# Patient Record
Sex: Male | Born: 1980 | Race: Black or African American | Hispanic: No | Marital: Married | Smoking: Former smoker
Health system: Southern US, Community
[De-identification: ages and names within clinical notes are randomized; demographics above are authoritative.]

## PROBLEM LIST (undated history)

## (undated) DIAGNOSIS — H5010 Unspecified exotropia: Secondary | ICD-10-CM

## (undated) HISTORY — PX: ROOT CANAL: SHX2363

---

## 2013-11-17 ENCOUNTER — Emergency Department (HOSPITAL_BASED_OUTPATIENT_CLINIC_OR_DEPARTMENT_OTHER): Payer: Worker's Compensation

## 2013-11-17 ENCOUNTER — Encounter (HOSPITAL_BASED_OUTPATIENT_CLINIC_OR_DEPARTMENT_OTHER): Payer: Self-pay | Admitting: Emergency Medicine

## 2013-11-17 ENCOUNTER — Emergency Department (HOSPITAL_BASED_OUTPATIENT_CLINIC_OR_DEPARTMENT_OTHER)
Admission: EM | Admit: 2013-11-17 | Discharge: 2013-11-17 | Disposition: A | Discharge status: Home / Self Care | Payer: Worker's Compensation | Attending: Emergency Medicine | Admitting: Emergency Medicine

## 2013-11-17 DIAGNOSIS — X500XXA Overexertion from strenuous movement or load, initial encounter: Secondary | ICD-10-CM | POA: Insufficient documentation

## 2013-11-17 DIAGNOSIS — Y9289 Other specified places as the place of occurrence of the external cause: Secondary | ICD-10-CM | POA: Insufficient documentation

## 2013-11-17 DIAGNOSIS — Y990 Civilian activity done for income or pay: Secondary | ICD-10-CM | POA: Insufficient documentation

## 2013-11-17 DIAGNOSIS — S43402A Unspecified sprain of left shoulder joint, initial encounter: Secondary | ICD-10-CM

## 2013-11-17 DIAGNOSIS — F17200 Nicotine dependence, unspecified, uncomplicated: Secondary | ICD-10-CM | POA: Insufficient documentation

## 2013-11-17 DIAGNOSIS — IMO0002 Reserved for concepts with insufficient information to code with codable children: Secondary | ICD-10-CM | POA: Insufficient documentation

## 2013-11-17 DIAGNOSIS — Y9389 Activity, other specified: Secondary | ICD-10-CM | POA: Insufficient documentation

## 2013-11-17 MED ORDER — HYDROCODONE-ACETAMINOPHEN 5-325 MG PO TABS: 2.0000 | ORAL_TABLET | ORAL | Status: DC | PRN

## 2013-11-17 MED ORDER — IBUPROFEN 600 MG PO TABS: 600.0000 mg | ORAL_TABLET | Freq: Four times a day (QID) | ORAL | Status: DC | PRN

## 2013-11-17 NOTE — ED Provider Notes (Signed)
CSN: [id number]     Arrival date & time 11/17/13  1014 History   First MD Initiated Contact with Patient 11/17/13 1113     Chief Complaint  Patient presents with  . Shoulder Injury   (Consider location/radiation/quality/duration/timing/severity/associated sxs/prior Treatment) HPI Comments: Patient presents with shoulder pain. He's states that yesterday he was lifting some furniture and at [employer] where he works and developed pain to his left shoulder. He states it's been hurting since that time. He has difficulty lifting his arm over his head. He had some tingling in his hand yesterday but none today. He denies any weakness in the arm other than diminished strength due to the pain. He has no prior injuries to her shoulder. He denies any other injuries. He describes as a constant throbbing pain.  Patient is a 32 y.o. male presenting with shoulder injury.  Shoulder Injury Pertinent negatives include no headaches.    No past medical history on file. No past surgical history on file. No family history on file. History  Substance Use Topics  . Smoking status: Current Every Day Smoker  . Smokeless tobacco: Not on file  . Alcohol Use: Not on file    Review of Systems  Constitutional: Negative for fever.  Respiratory: Negative.   Cardiovascular: Negative.   Gastrointestinal: Negative for nausea and vomiting.  Musculoskeletal: Positive for arthralgias and joint swelling. Negative for back pain and neck pain.  Skin: Negative for wound.  Neurological: Positive for numbness. Negative for weakness and headaches.    Allergies  Review of patient's allergies indicates no known allergies.  Home Medications   Current Outpatient Rx  Name  Route  Sig  Dispense  Refill  . HYDROcodone-acetaminophen (NORCO/VICODIN) 5-325 MG per tablet   Oral   Take 2 tablets by mouth every 4 (four) hours as needed.   15 tablet   0   . ibuprofen (ADVIL,MOTRIN) 600 MG tablet   Oral   Take 1 tablet  (600 mg total) by mouth every 6 (six) hours as needed.   30 tablet   0    BP 137/80  Pulse 57  Temp(Src) 98 F (36.7 C) (Oral)  Resp 16  Ht 6' (1.829 m)  Wt 180 lb (81.647 kg)  BMI 24.41 kg/m2  SpO2 100% Physical Exam  Constitutional: He is oriented to person, place, and time. He appears well-developed and well-nourished.  HENT:  Head: Normocephalic and atraumatic.  Eyes: Pupils are equal, round, and reactive to light.  Neck: Normal range of motion. Neck supple.  Cardiovascular: Normal rate, regular rhythm and normal heart sounds.   Pulmonary/Chest: Effort normal and breath sounds normal. No respiratory distress. He has no wheezes. He has no rales. He exhibits no tenderness.  Abdominal: Soft. Bowel sounds are normal. There is no tenderness. There is no rebound and no guarding.  Musculoskeletal: Normal range of motion. He exhibits edema.  Patient has some mild edema to the left shoulder. There is no pain along the clavicle. There is tenderness along the anterior portion the shoulder. There is pain on abduction and external rotation of the shoulder. He has normal sensation in the left hand. He has normal pulses in the left arm. He has normal motor function in the left hand. There is no pain to the elbow joint.  Lymphadenopathy:    He has no cervical adenopathy.  Neurological: He is alert and oriented to person, place, and time.  Skin: Skin is warm and dry. No rash noted.  Psychiatric: He  has a normal mood and affect.    ED Course  Procedures (including critical care time) Labs Review Labs Reviewed - No data to display Imaging Review Dg Shoulder Left  11/17/2013   CLINICAL DATA:  Left shoulder injury complaining of persistent left shoulder pain.  EXAM: LEFT SHOULDER - 2+ VIEW  COMPARISON:  No priors.  FINDINGS: Multiple views of the left shoulder demonstrate no acute displaced fracture, subluxation, dislocation, or soft tissue abnormality.  IMPRESSION: No acute radiographic  abnormality of the left shoulder.   Electronically Signed   By: Trudie Reed M.D.   On: 11/17/2013 12:28    EKG Interpretation   None       MDM   1. Shoulder sprain, left, initial encounter    There is no evidence of fracture. I feel he likely has a rotator cuff injury. He has no neurologic deficits currently. He was placed in a shoulder sling. He was advised in ice and elevation. He was given prescription for ibuprofen and Vicodin. He was given a referral to followup with Dr. Pearletha Forge.    Rolan Bucco, MD 11/17/13 [phone]

## 2013-11-17 NOTE — ED Notes (Signed)
Pt injured left shoulder while lifting shrimp boxes.  Pt has pain in shoulder to arm.  Difficulty lifting over his head.  Good pulses.

## 2013-11-27 ENCOUNTER — Ambulatory Visit (INDEPENDENT_AMBULATORY_CARE_PROVIDER_SITE_OTHER): Payer: Worker's Compensation | Admitting: Family Medicine

## 2013-11-27 ENCOUNTER — Encounter: Payer: Self-pay | Admitting: Family Medicine

## 2013-11-27 VITALS — BP 127/82 | HR 89 | Ht 73.0 in | Wt 185.0 lb

## 2013-11-27 DIAGNOSIS — S46909A Unspecified injury of unspecified muscle, fascia and tendon at shoulder and upper arm level, unspecified arm, initial encounter: Secondary | ICD-10-CM

## 2013-11-27 DIAGNOSIS — S4992XA Unspecified injury of left shoulder and upper arm, initial encounter: Secondary | ICD-10-CM

## 2013-11-27 DIAGNOSIS — S4980XA Other specified injuries of shoulder and upper arm, unspecified arm, initial encounter: Secondary | ICD-10-CM

## 2013-11-27 NOTE — Patient Instructions (Signed)
You have strained your biceps tendon and rotator cuff (supraspinatus). Rest is important initially - use sling. Try to avoid painful activities (overhead activities, lifting with extended arm) as much as possible. Aleve 2 tabs twice a day with food OR ibuprofen 3 tabs three times a day with food for pain and inflammation - take regularly for next 2 weeks. Can take tylenol in addition to this. At follow up we will likely start physical therapy with transition to home exercise program. Out of work for next 2 weeks - follow up at that time.

## 2013-11-28 ENCOUNTER — Encounter: Payer: Self-pay | Admitting: Family Medicine

## 2013-11-28 DIAGNOSIS — S4992XA Unspecified injury of left shoulder and upper arm, initial encounter: Secondary | ICD-10-CM | POA: Insufficient documentation

## 2013-11-28 NOTE — Assessment & Plan Note (Signed)
sustained at work.  Consistent with biceps tendon and supraspinatus strains.  Avoid overhead motions, lifting with extended arm.  NSAIDs, tylenol, rest with sling as needed for next 2 weeks.  Out of work in meantime - tried to go back too soon.  Icing as needed.  F/u in 2 weeks.

## 2013-11-28 NOTE — Progress Notes (Signed)
Patient ID: Kevin Garner, male   DOB: [birth year], 32 y.o.   MRN: [id number]  PCP: No PCP Per Patient  Subjective:   HPI: Patient is a 32 y.o. male here for left shoulder injury.  Patient reports on 11/29 he was lifting a 40 pound box of frozen shrimp at work when he felt a pull within left shoulder. Kept working through this that day but next morning could barely lift shoulder. Tried to go back yesterday but pain was too severe. No swelling or bruising. Using icy hot, ice pack, ibuprofen, and a sling. Radiographs were negative for fracture. No prior left shoulder injuries.  History reviewed. No pertinent past medical history.  Current Outpatient Prescriptions on File Prior to Visit  Medication Sig Dispense Refill  . HYDROcodone-acetaminophen (NORCO/VICODIN) 5-325 MG per tablet Take 2 tablets by mouth every 4 (four) hours as needed.  15 tablet  0  . ibuprofen (ADVIL,MOTRIN) 600 MG tablet Take 1 tablet (600 mg total) by mouth every 6 (six) hours as needed.  30 tablet  0   No current facility-administered medications on file prior to visit.    History reviewed. No pertinent past surgical history.  No Known Allergies  History   Social History  . Marital Status: Married    Spouse Name: N/A    Number of Children: N/A  . Years of Education: N/A   Occupational History  . Not on file.   Social History Main Topics  . Smoking status: Current Every Day Smoker  . Smokeless tobacco: Not on file  . Alcohol Use: Not on file  . Drug Use: Not on file  . Sexual Activity: Not on file   Other Topics Concern  . Not on file   Social History Narrative  . No narrative on file    Family History  Problem Relation Age of Onset  . Sudden death Neg Hx   . Hypertension Neg Hx   . Hyperlipidemia Neg Hx   . Heart attack Neg Hx   . Diabetes Neg Hx     BP 127/82  Pulse 89  Ht 6\' 1"  (1.854 m)  Wt 185 lb (83.915 kg)  BMI 24.41 kg/m2  Review of Systems: See HPI above.    Objective:   Physical Exam:  Gen: NAD  Left shoulder: No swelling, ecchymoses.  No gross deformity. TTP biceps tendon.  No AC, other tenderness. Abduction and flexion to about 110 degrees, full ER.   Positive Hawkins, Neers. Negative Yergasons. Strength 5-/5 with empty can, painful.  No pain and 5/5 strength with resisted internal/external rotation. Negative apprehension. NV intact distally.    Assessment & Plan:  1. Left shoulder injury - sustained at work.  Consistent with biceps tendon and supraspinatus strains.  Avoid overhead motions, lifting with extended arm.  NSAIDs, tylenol, rest with sling as needed for next 2 weeks.  Out of work in meantime - tried to go back too soon.  Icing as needed.  F/u in 2 weeks.

## 2013-12-11 ENCOUNTER — Ambulatory Visit: Payer: Worker's Compensation | Admitting: Family Medicine

## 2013-12-16 ENCOUNTER — Ambulatory Visit: Payer: Worker's Compensation | Admitting: Family Medicine

## 2018-02-24 DIAGNOSIS — K047 Periapical abscess without sinus: Secondary | ICD-10-CM | POA: Diagnosis not present

## 2018-08-28 DIAGNOSIS — M5442 Lumbago with sciatica, left side: Secondary | ICD-10-CM | POA: Diagnosis not present

## 2018-08-28 DIAGNOSIS — M5441 Lumbago with sciatica, right side: Secondary | ICD-10-CM | POA: Diagnosis not present

## 2019-08-20 DIAGNOSIS — Z20828 Contact with and (suspected) exposure to other viral communicable diseases: Secondary | ICD-10-CM | POA: Diagnosis not present

## 2019-08-29 DIAGNOSIS — Z3141 Encounter for fertility testing: Secondary | ICD-10-CM | POA: Diagnosis not present

## 2019-08-29 DIAGNOSIS — Z113 Encounter for screening for infections with a predominantly sexual mode of transmission: Secondary | ICD-10-CM | POA: Diagnosis not present

## 2019-09-06 DIAGNOSIS — Z3141 Encounter for fertility testing: Secondary | ICD-10-CM | POA: Diagnosis not present

## 2019-09-06 DIAGNOSIS — E291 Testicular hypofunction: Secondary | ICD-10-CM | POA: Diagnosis not present

## 2019-09-06 DIAGNOSIS — Z3184 Encounter for fertility preservation procedure: Secondary | ICD-10-CM | POA: Diagnosis not present

## 2019-12-10 DIAGNOSIS — K644 Residual hemorrhoidal skin tags: Secondary | ICD-10-CM | POA: Diagnosis not present

## 2020-06-04 ENCOUNTER — Ambulatory Visit: Payer: Self-pay | Admitting: Ophthalmology

## 2020-06-11 ENCOUNTER — Other Ambulatory Visit: Payer: Self-pay

## 2020-06-11 ENCOUNTER — Encounter (HOSPITAL_BASED_OUTPATIENT_CLINIC_OR_DEPARTMENT_OTHER): Payer: Self-pay | Admitting: Ophthalmology

## 2020-06-16 ENCOUNTER — Other Ambulatory Visit (HOSPITAL_COMMUNITY)
Admission: RE | Admit: 2020-06-16 | Discharge: 2020-06-16 | Disposition: A | Discharge status: Home / Self Care | Payer: BC Managed Care – PPO | Source: Ambulatory Visit | Attending: Gastroenterology | Admitting: Gastroenterology

## 2020-06-16 DIAGNOSIS — Z20822 Contact with and (suspected) exposure to covid-19: Secondary | ICD-10-CM | POA: Insufficient documentation

## 2020-06-16 DIAGNOSIS — Z01812 Encounter for preprocedural laboratory examination: Secondary | ICD-10-CM | POA: Diagnosis not present

## 2020-06-16 LAB — SARS CORONAVIRUS 2 (TAT 6-24 HRS): SARS Coronavirus 2: NEGATIVE

## 2020-06-19 ENCOUNTER — Encounter (HOSPITAL_BASED_OUTPATIENT_CLINIC_OR_DEPARTMENT_OTHER): Payer: Self-pay | Admitting: Ophthalmology

## 2020-06-19 ENCOUNTER — Other Ambulatory Visit: Payer: Self-pay

## 2020-06-19 ENCOUNTER — Ambulatory Visit (HOSPITAL_BASED_OUTPATIENT_CLINIC_OR_DEPARTMENT_OTHER)
Admission: RE | Admit: 2020-06-19 | Discharge: 2020-06-19 | Disposition: A | Discharge status: Home / Self Care | Payer: BC Managed Care – PPO | Attending: Ophthalmology | Admitting: Ophthalmology

## 2020-06-19 ENCOUNTER — Encounter (HOSPITAL_BASED_OUTPATIENT_CLINIC_OR_DEPARTMENT_OTHER)
Admission: RE | Disposition: A | Discharge status: Home / Self Care | Payer: Self-pay | Source: Home / Self Care | Attending: Ophthalmology

## 2020-06-19 ENCOUNTER — Ambulatory Visit (HOSPITAL_BASED_OUTPATIENT_CLINIC_OR_DEPARTMENT_OTHER): Payer: BC Managed Care – PPO | Admitting: Anesthesiology

## 2020-06-19 ENCOUNTER — Ambulatory Visit: Payer: Self-pay | Admitting: Ophthalmology

## 2020-06-19 DIAGNOSIS — H5010 Unspecified exotropia: Secondary | ICD-10-CM | POA: Diagnosis present

## 2020-06-19 DIAGNOSIS — H5021 Vertical strabismus, right eye: Secondary | ICD-10-CM | POA: Diagnosis not present

## 2020-06-19 DIAGNOSIS — Z87891 Personal history of nicotine dependence: Secondary | ICD-10-CM | POA: Insufficient documentation

## 2020-06-19 HISTORY — PX: STRABISMUS SURGERY: SHX218

## 2020-06-19 HISTORY — DX: Unspecified exotropia: H50.10

## 2020-06-19 SURGERY — REPAIR STRABISMUS
Anesthesia: General | Site: Eye | Laterality: Bilateral

## 2020-06-19 MED ORDER — PROPOFOL 10 MG/ML IV BOLUS
INTRAVENOUS | Status: AC
  Filled 2020-06-19: qty 20

## 2020-06-19 MED ORDER — FENTANYL CITRATE (PF) 100 MCG/2ML IJ SOLN: 25.0000 ug | INTRAMUSCULAR | Status: DC | PRN

## 2020-06-19 MED ORDER — PHENYLEPHRINE 40 MCG/ML (10ML) SYRINGE FOR IV PUSH (FOR BLOOD PRESSURE SUPPORT)
PREFILLED_SYRINGE | INTRAVENOUS | Status: AC
  Filled 2020-06-19: qty 10

## 2020-06-19 MED ORDER — SUCCINYLCHOLINE CHLORIDE 200 MG/10ML IV SOSY
PREFILLED_SYRINGE | INTRAVENOUS | Status: AC
  Filled 2020-06-19: qty 10

## 2020-06-19 MED ORDER — TOBRAMYCIN-DEXAMETHASONE 0.3-0.1 % OP OINT
TOPICAL_OINTMENT | OPHTHALMIC | Status: DC | PRN
  Administered 2020-06-19: 1 via OPHTHALMIC

## 2020-06-19 MED ORDER — LACTATED RINGERS IV SOLN: INTRAVENOUS | Status: DC

## 2020-06-19 MED ORDER — MIDAZOLAM HCL 5 MG/5ML IJ SOLN
INTRAMUSCULAR | Status: DC | PRN
  Administered 2020-06-19: 2 mg via INTRAVENOUS

## 2020-06-19 MED ORDER — DEXAMETHASONE SODIUM PHOSPHATE 4 MG/ML IJ SOLN
INTRAMUSCULAR | Status: DC | PRN
  Administered 2020-06-19: 10 mg via INTRAVENOUS

## 2020-06-19 MED ORDER — CELECOXIB 200 MG PO CAPS
200.0000 mg | ORAL_CAPSULE | Freq: Once | ORAL | Status: AC
  Administered 2020-06-19: 200 mg via ORAL

## 2020-06-19 MED ORDER — PROPOFOL 10 MG/ML IV BOLUS
INTRAVENOUS | Status: DC | PRN
  Administered 2020-06-19: 300 mg via INTRAVENOUS

## 2020-06-19 MED ORDER — CELECOXIB 200 MG PO CAPS
ORAL_CAPSULE | ORAL | Status: AC
  Filled 2020-06-19: qty 1

## 2020-06-19 MED ORDER — ACETAMINOPHEN 500 MG PO TABS
1000.0000 mg | ORAL_TABLET | Freq: Once | ORAL | Status: AC
  Administered 2020-06-19: 1000 mg via ORAL

## 2020-06-19 MED ORDER — MIDAZOLAM HCL 2 MG/2ML IJ SOLN
INTRAMUSCULAR | Status: AC
  Filled 2020-06-19: qty 2

## 2020-06-19 MED ORDER — EPHEDRINE 5 MG/ML INJ
INTRAVENOUS | Status: AC
  Filled 2020-06-19: qty 10

## 2020-06-19 MED ORDER — DIPHENHYDRAMINE HCL 50 MG/ML IJ SOLN
INTRAMUSCULAR | Status: DC | PRN
  Administered 2020-06-19: 6.25 mg via INTRAVENOUS

## 2020-06-19 MED ORDER — ONDANSETRON HCL 4 MG/2ML IJ SOLN
INTRAMUSCULAR | Status: AC
  Filled 2020-06-19: qty 2

## 2020-06-19 MED ORDER — KETOROLAC TROMETHAMINE 30 MG/ML IJ SOLN
INTRAMUSCULAR | Status: AC
  Filled 2020-06-19: qty 1

## 2020-06-19 MED ORDER — ACETAMINOPHEN 500 MG PO TABS
ORAL_TABLET | ORAL | Status: AC
  Filled 2020-06-19: qty 2

## 2020-06-19 MED ORDER — FENTANYL CITRATE (PF) 100 MCG/2ML IJ SOLN
INTRAMUSCULAR | Status: AC
  Filled 2020-06-19: qty 2

## 2020-06-19 MED ORDER — LIDOCAINE HCL (CARDIAC) PF 100 MG/5ML IV SOSY
PREFILLED_SYRINGE | INTRAVENOUS | Status: DC | PRN
  Administered 2020-06-19: 100 mg via INTRAVENOUS

## 2020-06-19 MED ORDER — KETOROLAC TROMETHAMINE 30 MG/ML IJ SOLN
INTRAMUSCULAR | Status: DC | PRN
  Administered 2020-06-19: 30 mg via INTRAVENOUS

## 2020-06-19 MED ORDER — PROPOFOL 500 MG/50ML IV EMUL
INTRAVENOUS | Status: AC
  Filled 2020-06-19: qty 50

## 2020-06-19 MED ORDER — FENTANYL CITRATE (PF) 100 MCG/2ML IJ SOLN
INTRAMUSCULAR | Status: DC | PRN
  Administered 2020-06-19: 100 ug via INTRAVENOUS
  Administered 2020-06-19: 50 ug via INTRAVENOUS

## 2020-06-19 MED ORDER — DEXAMETHASONE SODIUM PHOSPHATE 10 MG/ML IJ SOLN
INTRAMUSCULAR | Status: AC
  Filled 2020-06-19: qty 1

## 2020-06-19 SURGICAL SUPPLY — 32 items
APPLICATOR COTTON TIP 6 STRL (MISCELLANEOUS) ×4 IMPLANT
APPLICATOR COTTON TIP 6IN STRL (MISCELLANEOUS) ×8 IMPLANT
APPLICATOR DR MATTHEWS STRL (MISCELLANEOUS) ×2 IMPLANT
BNDG EYE OVAL (GAUZE/BANDAGES/DRESSINGS) IMPLANT
CAUTERY EYE LOW TEMP 1300F FIN (OPHTHALMIC RELATED) IMPLANT
COVER BACK TABLE 60X90IN (DRAPES) ×2 IMPLANT
COVER MAYO STAND STRL (DRAPES) ×2 IMPLANT
COVER WAND RF STERILE (DRAPES) IMPLANT
DRAPE SURG 17X23 STRL (DRAPES) ×4 IMPLANT
DRAPE U-SHAPE 76X120 STRL (DRAPES) ×1 IMPLANT
GLOVE BIO SURGEON STRL SZ 6.5 (GLOVE) ×4 IMPLANT
GLOVE BIOGEL M STRL SZ7.5 (GLOVE) ×2 IMPLANT
GLOVE ECLIPSE 6.5 STRL STRAW (GLOVE) ×1 IMPLANT
GOWN STRL REUS W/ TWL LRG LVL3 (GOWN DISPOSABLE) ×1 IMPLANT
GOWN STRL REUS W/TWL LRG LVL3 (GOWN DISPOSABLE) ×2
GOWN STRL REUS W/TWL XL LVL3 (GOWN DISPOSABLE) ×2 IMPLANT
NS IRRIG 1000ML POUR BTL (IV SOLUTION) ×2 IMPLANT
SET BASIN DAY SURGERY F.S. (CUSTOM PROCEDURE TRAY) ×2 IMPLANT
SHEET MEDIUM DRAPE 40X70 STRL (DRAPES) IMPLANT
SPEAR EYE SURG WECK-CEL (MISCELLANEOUS) ×4 IMPLANT
STRIP CLOSURE SKIN 1/4X4 (GAUZE/BANDAGES/DRESSINGS) IMPLANT
SUT 6 0 SILK T G140 8DA (SUTURE) IMPLANT
SUT MERSILENE 6-0 18IN S14 8MM (SUTURE)
SUT PLAIN 6 0 TG1408 (SUTURE) ×1 IMPLANT
SUT SILK 4 0 C 3 735G (SUTURE) IMPLANT
SUT VICRYL 6 0 S 28 (SUTURE) IMPLANT
SUT VICRYL ABS 6-0 S29 18IN (SUTURE) ×2 IMPLANT
SUTURE MERSLN 6-0 18IN S14 8MM (SUTURE) IMPLANT
SYR 10ML LL (SYRINGE) ×2 IMPLANT
SYR TB 1ML LL NO SAFETY (SYRINGE) ×2 IMPLANT
TOWEL GREEN STERILE FF (TOWEL DISPOSABLE) ×2 IMPLANT
TRAY DSU PREP LF (CUSTOM PROCEDURE TRAY) ×2 IMPLANT

## 2020-06-19 NOTE — Interval H&P Note (Signed)
History and Physical Interval Note:  06/19/2020 10:03 AM  Kevin Garner  has presented today for surgery, with the diagnosis of EXTROPIA.  The various methods of treatment have been discussed with the patient and family. After consideration of risks, benefits and other options for treatment, the patient has consented to  Procedure(s): REPAIR STRABISMUS (Bilateral) as a surgical intervention.  The patient's history has been reviewed, patient examined, no change in status, stable for surgery.  I have reviewed the patient's chart and labs.  Questions were answered to the patient's satisfaction.     Shara Blazing

## 2020-06-19 NOTE — Op Note (Signed)
06/19/2020  11:16 AM  PATIENT:  Kevin Garner  39 y.o. male  PRE-OPERATIVE DIAGNOSIS:  Exotropia      POST-OPERATIVE DIAGNOSIS:  Exotropia     PROCEDURE:  Lateral rectus muscle recession 9.0 mm both eyes  SURGEON:  Pasty Spillers.Maple Hudson, M.D.   ANESTHESIA:   general  COMPLICATIONS:None  DESCRIPTION OF PROCEDURE: The patient was taken to the operating room where He was identified by me. General anesthesia was induced without difficulty after placement of appropriate monitors. The patient was prepped and draped in standard sterile fashion. A lid speculum was placed in the right eye.  Through an inferotemporal fornix incision through conjunctiva and Tenon's fascia, the right lateral rectus muscle was engaged on a series of muscle hooks and cleared of its fascial attachments. The tendon was secured with a double-armed 6-0 Vicryl suture with a double locking bite at each border of the muscle, 1 mm from the insertion. The muscle was disinserted, and was reattached to sclera at a measured distance of 9.0 millimeters posterior to the original insertion, using direct scleral passes in crossed swords fashion.  The suture ends were tied securely after the position of the muscle had been checked and found to be accurate. Conjunctiva was closed with 1 6-0 plain gut suture.  The speculum was transferred to the right eye, where an identical procedure was performed, again effecting a 9.0 millimeters recession of the lateral rectus muscle. TobraDex ointment was placed in both eyes. The patient was awakened without difficulty and taken to the recovery room in stable condition, having suffered no intraoperative or immediate postoperative complications.  Pasty Spillers. Muhanad Torosyan M.D.    PATIENT DISPOSITION:  PACU - hemodynamically stable.

## 2020-06-19 NOTE — Anesthesia Postprocedure Evaluation (Signed)
Anesthesia Post Note  Patient: Kevin Garner  Procedure(s) Performed: BILATERAL STRABISMUS REPAIR (Bilateral Eye)     Patient location during evaluation: PACU Anesthesia Type: General Level of consciousness: awake and alert Pain management: pain level controlled Vital Signs Assessment: post-procedure vital signs reviewed and stable Respiratory status: spontaneous breathing, nonlabored ventilation and respiratory function stable Cardiovascular status: blood pressure returned to baseline and stable Postop Assessment: no apparent nausea or vomiting Anesthetic complications: no   No complications documented.  Last Vitals:  Vitals:   06/19/20 1153 06/19/20 1200  BP:  (!) 143/96  Pulse:  86  Resp:  13  Temp:    SpO2: 96% 96%    Last Pain:  Vitals:   06/19/20 1153  TempSrc:   PainSc: 1                  Maikayla Beggs,W. EDMOND

## 2020-06-19 NOTE — Anesthesia Procedure Notes (Signed)
Procedure Name: LMA Insertion Date/Time: 06/19/2020 10:24 AM Performed by: Ronnette Hila, CRNA Pre-anesthesia Checklist: Patient identified, Emergency Drugs available, Suction available and Patient being monitored Patient Re-evaluated:Patient Re-evaluated prior to induction Oxygen Delivery Method: Circle system utilized Preoxygenation: Pre-oxygenation with 100% oxygen Induction Type: IV induction Ventilation: Mask ventilation without difficulty LMA: LMA flexible inserted LMA Size: 4.0 and 5.0 Number of attempts: 1 Airway Equipment and Method: Bite block Placement Confirmation: positive ETCO2 Tube secured with: Tape Dental Injury: Teeth and Oropharynx as per pre-operative assessment

## 2020-06-19 NOTE — H&P (Signed)
Date of examination:  06-02-20  Indication for surgery: to straighten the eyes and allow some binocularity  Pertinent past medical history:  Past Medical History:  Diagnosis Date  . Exotropia     Pertinent ocular history:  LXT since childhood  Pertinent family history:  Family History  Problem Relation Age of Onset  . Sudden death Neg Hx   . Hypertension Neg Hx   . Hyperlipidemia Neg Hx   . Heart attack Neg Hx   . Diabetes Neg Hx     General:  Healthy appearing patient in no distress.    Eyes:    Acuity [place]  OD 20/20  OS 20/20  External: Within normal limits     Anterior segment: Within normal limits     Motility:   XT=50 comitant, XT'=52, LHT=8  Fundus: deferred  Refraction:  [place] OU approx  Heart: Regular rate and rhythm without murmur     Lungs: Clear to auscultation     Impression:Exotropia, longstanding  Right hypertropia small  Plan: Lateral rectus muscle recession both eyes  Shara Blazing

## 2020-06-19 NOTE — Transfer of Care (Signed)
Immediate Anesthesia Transfer of Care Note  Patient: Kevin Garner  Procedure(s) Performed: BILATERAL STRABISMUS REPAIR (Bilateral Eye)  Patient Location: PACU  Anesthesia Type:General  Level of Consciousness: sedated  Airway & Oxygen Therapy: Patient Spontanous Breathing and Patient connected to face mask oxygen  Post-op Assessment: Report given to RN and Post -op Vital signs reviewed and stable  Post vital signs: Reviewed and stable  Last Vitals:  Vitals Value Taken Time  BP    Temp    Pulse 95 06/19/20 1123  Resp 18 06/19/20 1123  SpO2 92 % 06/19/20 1123  Vitals shown include unvalidated device data.  Last Pain:  Vitals:   06/19/20 0856  TempSrc: Oral  PainSc: 0-No pain         Complications: No complications documented.

## 2020-06-19 NOTE — Discharge Instructions (Signed)
Diet: Clear liquids, advance to soft foods then regular diet as tolerated by this evening.  Pain control:   1)  Ibuprofen 600 mg by mouth every 6-8 hours as needed for pain  2)  Ice pack/cold compress to operated eye(s) as desired  Eye medications:    Tobradex or Zylet eye ointment 1/2 inch in operated eye(s) twice a day if directed to do so by Dr. Maple Hudson  Activity: No swimming for 1 week.  It is OK to let water run over the face and eyes while showering or taking a bath, even during the first week.  No other restriction on exercise or activity.  Call Dr. Roxy Cedar office [phone] with any problems or concerns.    NO TYLENOL PRODUCTS UNTIL 3:30  NO ADVIL/ MOTRIN UNTIL  7:00 PM    Post Anesthesia Home Care Instructions  Activity: Get plenty of rest for the remainder of the day. A responsible individual must stay with you for 24 hours following the procedure.  For the next 24 hours, DO NOT: -Drive a car -Advertising copywriter -Drink alcoholic beverages -Take any medication unless instructed by your physician -Make any legal decisions or sign important papers.  Meals: Start with liquid foods such as gelatin or soup. Progress to regular foods as tolerated. Avoid greasy, spicy, heavy foods. If nausea and/or vomiting occur, drink only clear liquids until the nausea and/or vomiting subsides. Call your physician if vomiting continues.  Special Instructions/Symptoms: Your throat may feel dry or sore from the anesthesia or the breathing tube placed in your throat during surgery. If this causes discomfort, gargle with warm salt water. The discomfort should disappear within 24 hours.  If you had a scopolamine patch placed behind your ear for the management of post- operative nausea and/or vomiting:  1. The medication in the patch is effective for 72 hours, after which it should be removed.  Wrap patch in a tissue and discard in the trash. Wash hands thoroughly with soap and water. 2. You may  remove the patch earlier than 72 hours if you experience unpleasant side effects which may include dry mouth, dizziness or visual disturbances. 3. Avoid touching the patch. Wash your hands with soap and water after contact with the patch.

## 2020-06-19 NOTE — Anesthesia Preprocedure Evaluation (Addendum)
Anesthesia Evaluation  Patient identified by MRN, date of birth, ID band Patient awake    Reviewed: Allergy & Precautions, H&P , NPO status , Patient's Chart, lab work & pertinent test results  Airway Mallampati: II  TM Distance: >3 FB Neck ROM: Full    Dental no notable dental hx. (+) Teeth Intact, Dental Advisory Given   Pulmonary neg pulmonary ROS, former smoker,    Pulmonary exam normal breath sounds clear to auscultation       Cardiovascular negative cardio ROS   Rhythm:Regular Rate:Normal     Neuro/Psych negative neurological ROS  negative psych ROS   GI/Hepatic negative GI ROS, Neg liver ROS,   Endo/Other  negative endocrine ROS  Renal/GU negative Renal ROS  negative genitourinary   Musculoskeletal   Abdominal   Peds  Hematology negative hematology ROS (+)   Anesthesia Other Findings   Reproductive/Obstetrics negative OB ROS                            Anesthesia Physical Anesthesia Plan  ASA: I  Anesthesia Plan: General   Post-op Pain Management:    Induction: Intravenous  PONV Risk Score and Plan: 3 and Ondansetron, Dexamethasone and Midazolam  Airway Management Planned: LMA  Additional Equipment:   Intra-op Plan:   Post-operative Plan: Extubation in OR  Informed Consent: I have reviewed the patients History and Physical, chart, labs and discussed the procedure including the risks, benefits and alternatives for the proposed anesthesia with the patient or authorized representative who has indicated his/her understanding and acceptance.     Dental advisory given  Plan Discussed with: CRNA  Anesthesia Plan Comments:         Anesthesia Quick Evaluation  

## 2020-06-19 NOTE — H&P (View-Only) (Signed)
Date of examination:  06-02-20  Indication for surgery: to straighten the eyes and allow some binocularity  Pertinent past medical history:  Past Medical History:  Diagnosis Date  . Exotropia     Pertinent ocular history:  LXT since childhood  Pertinent family history:  Family History  Problem Relation Age of Onset  . Sudden death Neg Hx   . Hypertension Neg Hx   . Hyperlipidemia Neg Hx   . Heart attack Neg Hx   . Diabetes Neg Hx     General:  Healthy appearing patient in no distress.    Eyes:    Acuity   OD 20/20  OS 20/20  External: Within normal limits     Anterior segment: Within normal limits     Motility:   XT=50 comitant, XT'=52, LHT=8  Fundus: deferred  Refraction:  [place] OU approx  Heart: Regular rate and rhythm without murmur     Lungs: Clear to auscultation     Impression:Exotropia, longstanding  Right hypertropia small  Plan: Lateral rectus muscle recession both eyes  Kevin Garner  

## 2020-06-22 ENCOUNTER — Encounter (HOSPITAL_BASED_OUTPATIENT_CLINIC_OR_DEPARTMENT_OTHER): Payer: Self-pay | Admitting: Ophthalmology

## 2020-08-21 ENCOUNTER — Ambulatory Visit: Payer: Self-pay | Admitting: Ophthalmology

## 2020-08-27 ENCOUNTER — Other Ambulatory Visit: Payer: Self-pay

## 2020-08-27 ENCOUNTER — Encounter (HOSPITAL_BASED_OUTPATIENT_CLINIC_OR_DEPARTMENT_OTHER): Payer: Self-pay | Admitting: Ophthalmology

## 2020-09-01 ENCOUNTER — Inpatient Hospital Stay (HOSPITAL_COMMUNITY): Admission: RE | Admit: 2020-09-01 | Payer: BC Managed Care – PPO | Source: Ambulatory Visit

## 2020-09-02 ENCOUNTER — Other Ambulatory Visit (HOSPITAL_COMMUNITY)
Admission: RE | Admit: 2020-09-02 | Discharge: 2020-09-02 | Disposition: A | Discharge status: Home / Self Care | Payer: BC Managed Care – PPO | Source: Ambulatory Visit | Attending: Ophthalmology | Admitting: Ophthalmology

## 2020-09-02 DIAGNOSIS — Z20822 Contact with and (suspected) exposure to covid-19: Secondary | ICD-10-CM | POA: Insufficient documentation

## 2020-09-02 DIAGNOSIS — Z01818 Encounter for other preprocedural examination: Secondary | ICD-10-CM | POA: Insufficient documentation

## 2020-09-02 LAB — SARS CORONAVIRUS 2 (TAT 6-24 HRS): SARS Coronavirus 2: NEGATIVE

## 2020-09-03 ENCOUNTER — Ambulatory Visit: Payer: Self-pay | Admitting: Ophthalmology

## 2020-09-03 NOTE — H&P (View-Only) (Signed)
Date of examination:  08-11-20  Indication for surgery: to straighten the eyes and allow some binocularity  Pertinent past medical history:  Past Medical History:  Diagnosis Date  . Exotropia     Pertinent ocular history:  XT since childhood.  LR recess OU 6/21, has considerable residual XT.  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 Sudden death Neg Hx   . Hypertension Neg Hx   . Hyperlipidemia Neg Hx   . Heart attack Neg Hx   . Diabetes Neg Hx     General:  Healthy appearing patient in no distress.    Eyes:    Acuity [place] OD 20/20  OS 20/20  External: Within normal limits     Anterior segment: Within normal limits x healed conj scars OU  Motility:   XT to 35, XT'=40, LHT 4, note XT 20 in R or L gaze  Fundus: deferred  Heart: Regular rate and rhythm without murmur     Lungs: Clear to auscultation     Impression:Exotropia, residual, s/p LR recess OU  Plan: Medial rectus muscle resection both eyes  Shara Blazing

## 2020-09-03 NOTE — H&P (Signed)
Date of examination:  08-11-20  Indication for surgery: to straighten the eyes and allow some binocularity  Pertinent past medical history:  Past Medical History:  Diagnosis Date  . Exotropia     Pertinent ocular history:  XT since childhood.  LR recess OU 6/21, has considerable residual XT.  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 Sudden death Neg Hx   . Hypertension Neg Hx   . Hyperlipidemia Neg Hx   . Heart attack Neg Hx   . Diabetes Neg Hx     General:  Healthy appearing patient in no distress.    Eyes:    Acuity [place] OD 20/20  OS 20/20  External: Within normal limits     Anterior segment: Within normal limits x healed conj scars OU  Motility:   XT to 35, XT'=40, LHT 4, note XT 20 in R or L gaze  Fundus: deferred  Heart: Regular rate and rhythm without murmur     Lungs: Clear to auscultation     Impression:Exotropia, residual, s/p LR recess OU  Plan: Medial rectus muscle resection both eyes  Kevin Garner  

## 2020-09-04 ENCOUNTER — Ambulatory Visit (HOSPITAL_BASED_OUTPATIENT_CLINIC_OR_DEPARTMENT_OTHER): Payer: BC Managed Care – PPO | Admitting: Certified Registered"

## 2020-09-04 ENCOUNTER — Encounter (HOSPITAL_BASED_OUTPATIENT_CLINIC_OR_DEPARTMENT_OTHER)
Admission: RE | Disposition: A | Discharge status: Home / Self Care | Payer: Self-pay | Source: Home / Self Care | Attending: Ophthalmology

## 2020-09-04 ENCOUNTER — Ambulatory Visit (HOSPITAL_BASED_OUTPATIENT_CLINIC_OR_DEPARTMENT_OTHER)
Admission: RE | Admit: 2020-09-04 | Discharge: 2020-09-04 | Disposition: A | Discharge status: Home / Self Care | Payer: BC Managed Care – PPO | Attending: Ophthalmology | Admitting: Ophthalmology

## 2020-09-04 ENCOUNTER — Encounter (HOSPITAL_BASED_OUTPATIENT_CLINIC_OR_DEPARTMENT_OTHER): Payer: Self-pay | Admitting: Ophthalmology

## 2020-09-04 DIAGNOSIS — Z87891 Personal history of nicotine dependence: Secondary | ICD-10-CM | POA: Diagnosis not present

## 2020-09-04 DIAGNOSIS — H5010 Unspecified exotropia: Secondary | ICD-10-CM | POA: Diagnosis not present

## 2020-09-04 HISTORY — PX: STRABISMUS SURGERY: SHX218

## 2020-09-04 SURGERY — STRABISMUS SURGERY, BILATERAL
Anesthesia: General | Site: Eye | Laterality: Bilateral

## 2020-09-04 MED ORDER — FENTANYL CITRATE (PF) 100 MCG/2ML IJ SOLN
INTRAMUSCULAR | Status: AC
  Filled 2020-09-04: qty 2

## 2020-09-04 MED ORDER — PROPOFOL 10 MG/ML IV BOLUS
INTRAVENOUS | Status: DC | PRN
  Administered 2020-09-04: 300 mg via INTRAVENOUS

## 2020-09-04 MED ORDER — OXYMETAZOLINE HCL 0.05 % NA SOLN
NASAL | Status: AC
  Filled 2020-09-04: qty 30

## 2020-09-04 MED ORDER — ONDANSETRON HCL 4 MG/2ML IJ SOLN
INTRAMUSCULAR | Status: DC | PRN
  Administered 2020-09-04: 4 mg via INTRAVENOUS

## 2020-09-04 MED ORDER — MIDAZOLAM HCL 2 MG/2ML IJ SOLN
INTRAMUSCULAR | Status: AC
  Filled 2020-09-04: qty 2

## 2020-09-04 MED ORDER — MIDAZOLAM HCL 5 MG/5ML IJ SOLN
INTRAMUSCULAR | Status: DC | PRN
  Administered 2020-09-04: 2 mg via INTRAVENOUS

## 2020-09-04 MED ORDER — TOBRAMYCIN-DEXAMETHASONE 0.3-0.1 % OP OINT
TOPICAL_OINTMENT | OPHTHALMIC | Status: DC | PRN
  Administered 2020-09-04: 1 via OPHTHALMIC

## 2020-09-04 MED ORDER — PROPOFOL 10 MG/ML IV BOLUS
INTRAVENOUS | Status: AC
  Filled 2020-09-04: qty 40

## 2020-09-04 MED ORDER — OXYCODONE HCL 5 MG PO TABS: 5.0000 mg | ORAL_TABLET | Freq: Once | ORAL | Status: DC | PRN

## 2020-09-04 MED ORDER — AMISULPRIDE (ANTIEMETIC) 5 MG/2ML IV SOLN: 10.0000 mg | Freq: Once | INTRAVENOUS | Status: DC | PRN

## 2020-09-04 MED ORDER — LIDOCAINE 2% (20 MG/ML) 5 ML SYRINGE
INTRAMUSCULAR | Status: DC | PRN
  Administered 2020-09-04: 100 mg via INTRAVENOUS

## 2020-09-04 MED ORDER — KETOROLAC TROMETHAMINE 30 MG/ML IJ SOLN
INTRAMUSCULAR | Status: DC | PRN
  Administered 2020-09-04: 30 mg via INTRAVENOUS

## 2020-09-04 MED ORDER — FENTANYL CITRATE (PF) 100 MCG/2ML IJ SOLN
INTRAMUSCULAR | Status: DC | PRN
Start: 2020-09-04 — End: 2020-09-04
  Administered 2020-09-04 (×2): 25 ug via INTRAVENOUS
  Administered 2020-09-04: 50 ug via INTRAVENOUS
  Administered 2020-09-04 (×2): 25 ug via INTRAVENOUS

## 2020-09-04 MED ORDER — OXYCODONE HCL 5 MG/5ML PO SOLN: 5.0000 mg | Freq: Once | ORAL | Status: DC | PRN

## 2020-09-04 MED ORDER — LABETALOL HCL 5 MG/ML IV SOLN
INTRAVENOUS | Status: AC
  Filled 2020-09-04: qty 4

## 2020-09-04 MED ORDER — BSS IO SOLN
INTRAOCULAR | Status: AC
  Filled 2020-09-04: qty 15

## 2020-09-04 MED ORDER — ACETAMINOPHEN 10 MG/ML IV SOLN: 1000.0000 mg | Freq: Once | INTRAVENOUS | Status: DC | PRN

## 2020-09-04 MED ORDER — LABETALOL HCL 5 MG/ML IV SOLN
5.0000 mg | Freq: Once | INTRAVENOUS | Status: AC
  Administered 2020-09-04: 5 mg via INTRAVENOUS

## 2020-09-04 MED ORDER — DEXAMETHASONE SODIUM PHOSPHATE 10 MG/ML IJ SOLN
INTRAMUSCULAR | Status: DC | PRN
  Administered 2020-09-04: 10 mg via INTRAVENOUS

## 2020-09-04 MED ORDER — ARTIFICIAL TEARS OPHTHALMIC OINT
TOPICAL_OINTMENT | OPHTHALMIC | Status: AC
  Filled 2020-09-04: qty 3.5

## 2020-09-04 MED ORDER — LACTATED RINGERS IV SOLN: INTRAVENOUS | Status: DC

## 2020-09-04 MED ORDER — ACETAMINOPHEN 160 MG/5ML PO SOLN: 325.0000 mg | Freq: Once | ORAL | Status: DC | PRN

## 2020-09-04 MED ORDER — MEPERIDINE HCL 25 MG/ML IJ SOLN: 6.2500 mg | INTRAMUSCULAR | Status: DC | PRN

## 2020-09-04 MED ORDER — ACETAMINOPHEN 325 MG PO TABS: 325.0000 mg | ORAL_TABLET | Freq: Once | ORAL | Status: DC | PRN

## 2020-09-04 MED ORDER — LIDOCAINE-EPINEPHRINE 1 %-1:100000 IJ SOLN
INTRAMUSCULAR | Status: AC
  Filled 2020-09-04: qty 1

## 2020-09-04 MED ORDER — TOBRAMYCIN-DEXAMETHASONE 0.3-0.1 % OP OINT
TOPICAL_OINTMENT | OPHTHALMIC | Status: AC
  Filled 2020-09-04: qty 3.5

## 2020-09-04 MED ORDER — FENTANYL CITRATE (PF) 100 MCG/2ML IJ SOLN
25.0000 ug | INTRAMUSCULAR | Status: DC | PRN
  Administered 2020-09-04: 25 ug via INTRAVENOUS

## 2020-09-04 MED ORDER — LIDOCAINE 2% (20 MG/ML) 5 ML SYRINGE
INTRAMUSCULAR | Status: AC
  Filled 2020-09-04: qty 5

## 2020-09-04 SURGICAL SUPPLY — 33 items
APL SRG 3 HI ABS STRL LF PLS (MISCELLANEOUS) ×1
APL SWBSTK 6 STRL LF DISP (MISCELLANEOUS) ×4
APPLICATOR COTTON TIP 6 STRL (MISCELLANEOUS) ×4 IMPLANT
APPLICATOR COTTON TIP 6IN STRL (MISCELLANEOUS) ×8
APPLICATOR DR MATTHEWS STRL (MISCELLANEOUS) ×2 IMPLANT
BNDG EYE OVAL (GAUZE/BANDAGES/DRESSINGS) IMPLANT
CAUTERY EYE LOW TEMP 1300F FIN (OPHTHALMIC RELATED) IMPLANT
COVER BACK TABLE 60X90IN (DRAPES) ×2 IMPLANT
COVER MAYO STAND STRL (DRAPES) ×2 IMPLANT
COVER WAND RF STERILE (DRAPES) IMPLANT
DRAPE SURG 17X23 STRL (DRAPES) ×4 IMPLANT
DRAPE U-SHAPE 76X120 STRL (DRAPES) ×2 IMPLANT
GLOVE BIO SURGEON STRL SZ 6.5 (GLOVE) ×4 IMPLANT
GLOVE BIOGEL M STRL SZ7.5 (GLOVE) ×2 IMPLANT
GOWN STRL REUS W/ TWL LRG LVL3 (GOWN DISPOSABLE) ×1 IMPLANT
GOWN STRL REUS W/TWL LRG LVL3 (GOWN DISPOSABLE) ×2
GOWN STRL REUS W/TWL XL LVL3 (GOWN DISPOSABLE) ×2 IMPLANT
NS IRRIG 1000ML POUR BTL (IV SOLUTION) ×2 IMPLANT
PACK BASIN DAY SURGERY FS (CUSTOM PROCEDURE TRAY) ×2 IMPLANT
SHEET MEDIUM DRAPE 40X70 STRL (DRAPES) IMPLANT
SPEAR EYE SURG WECK-CEL (MISCELLANEOUS) ×4 IMPLANT
STRIP CLOSURE SKIN 1/4X4 (GAUZE/BANDAGES/DRESSINGS) IMPLANT
SUT 6 0 SILK T G140 8DA (SUTURE) IMPLANT
SUT MERSILENE 6-0 18IN S14 8MM (SUTURE)
SUT PLAIN 6 0 TG1408 (SUTURE) ×2 IMPLANT
SUT SILK 4 0 C 3 735G (SUTURE) IMPLANT
SUT VICRYL 6 0 S 28 (SUTURE) ×4 IMPLANT
SUT VICRYL ABS 6-0 S29 18IN (SUTURE) IMPLANT
SUTURE MERSLN 6-0 18IN S14 8MM (SUTURE) IMPLANT
SYR 10ML LL (SYRINGE) ×2 IMPLANT
SYR TB 1ML LL NO SAFETY (SYRINGE) ×2 IMPLANT
TOWEL GREEN STERILE FF (TOWEL DISPOSABLE) ×2 IMPLANT
TRAY DSU PREP LF (CUSTOM PROCEDURE TRAY) ×2 IMPLANT

## 2020-09-04 NOTE — Anesthesia Procedure Notes (Signed)
Procedure Name: LMA Insertion Date/Time: 09/04/2020 7:40 AM Performed by: Marny Lowenstein, CRNA Pre-anesthesia Checklist: Patient identified, Emergency Drugs available, Suction available and Patient being monitored Patient Re-evaluated:Patient Re-evaluated prior to induction Oxygen Delivery Method: Circle system utilized Preoxygenation: Pre-oxygenation with 100% oxygen Induction Type: IV induction Ventilation: Mask ventilation without difficulty LMA: LMA inserted LMA Size: 5.0 Number of attempts: 1 Placement Confirmation: positive ETCO2 and breath sounds checked- equal and bilateral Tube secured with: Tape Dental Injury: Teeth and Oropharynx as per pre-operative assessment  Comments: Pt with chipped front right tooth prior to induction; intact after LMA placement

## 2020-09-04 NOTE — Transfer of Care (Signed)
Immediate Anesthesia Transfer of Care Note  Patient: Kevin Garner  Procedure(s) Performed: BILATERAL STRABISMUS REPAIR (Bilateral Eye)  Patient Location: PACU  Anesthesia Type:General  Level of Consciousness: drowsy and patient cooperative  Airway & Oxygen Therapy: Patient Spontanous Breathing and Patient connected to face mask oxygen  Post-op Assessment: Report given to RN and Post -op Vital signs reviewed and stable  Post vital signs: Reviewed and stable  Last Vitals:  Vitals Value Taken Time  BP 158/103 09/04/20 0906  Temp    Pulse 98 09/04/20 0908  Resp 16 09/04/20 0908  SpO2 97 % 09/04/20 0908  Vitals shown include unvalidated device data.  Last Pain:  Vitals:   09/04/20 0653  TempSrc: Oral  PainSc: 0-No pain         Complications: No complications documented.

## 2020-09-04 NOTE — Op Note (Signed)
09/04/2020  8:59 AM  PATIENT:  Kevin Garner    PRE-OPERATIVE DIAGNOSIS:  Exotropia, residual/recurrent  POST-OPERATIVE DIAGNOSIS:  Exotropia, residual/recurrent  PROCEDURE:  Medial rectus muscle resection, 7.0 mm both eyes  SURGEON:  Shara Blazing, MD  ANESTHESIA:   General  COMPLICATIONS: none  OPERATIVE PROCEDURE: After routine preoperative evaluation including informed consent from the parent, the patient was taken to the operative room where He was identified by me. General anesthesia was induced without difficulty after placement of appropriate monitors. The patient was prepped and draped in standard sterile fashion. A lid speculum was placed in the left eye.  Through an inferonasal fornix incision through conjunctiva and Tenon's fascia, the left medial rectus muscle was engaged on a series of muscle hooks and carefully cleared of its fascial attachments to at least 15 mm posterior to the insertion. The muscle was spread between 2 self-retaining hooks. A 2 mm bite was taken of the center of the muscle belly at a measured distance of 7.0 mm posterior to the insertion, and a knot was tied securely at this location. The needle at each end of the double-armed suture was passed from the center of the muscle belly to the periphery, parallel to and 7.0 mm posterior to the insertion. A locking bite was placed at each border of the muscle. A resection clamp was placed on the muscle just anterior to the sutures. The muscle was disinserted. Each pole suture was passed posteriorly to anteriorly through the corresponding end of the muscle stump, then anteriorly to posteriorly near the center of the stump, then posteriorly to anteriorly through the center of the muscle belly, just posterior to the previously placed knot. The muscle was drawn up to the level of the original insertion. The clamp was removed. The suture ends were tied securely after all slack had been removed. The portion of the muscle  anterior to the sutures was carefully excised. Conjunctiva was closed with a single 6-0 plain gut suture.  The speculum was transferred to the right eye, where an identical procedure was performed, again effecting a 7.0 mm resection of the medial rectus muscle. Tobradex ophthalmic ointment was placed in the both eye(s). The patient was awakened without difficulty and taken to the recovery room in stable condition, having suffered no intraoperative or immediate postoperative competitions.  Shara Blazing, MD

## 2020-09-04 NOTE — Anesthesia Preprocedure Evaluation (Signed)
Anesthesia Evaluation  Patient identified by MRN, date of birth, ID band Patient awake    Reviewed: Allergy & Precautions, NPO status , Patient's Chart, lab work & pertinent test results  Airway Mallampati: III  TM Distance: >3 FB Neck ROM: Full    Dental  (+) Chipped,    Pulmonary former smoker,    Pulmonary exam normal        Cardiovascular negative cardio ROS Normal cardiovascular exam     Neuro/Psych negative neurological ROS  negative psych ROS   GI/Hepatic negative GI ROS, Neg liver ROS,   Endo/Other  negative endocrine ROS  Renal/GU negative Renal ROS     Musculoskeletal   Abdominal   Peds  Hematology negative hematology ROS (+)   Anesthesia Other Findings   Reproductive/Obstetrics                             Anesthesia Physical Anesthesia Plan  ASA: II  Anesthesia Plan: General   Post-op Pain Management:    Induction: Intravenous  PONV Risk Score and Plan: 3 and Ondansetron, Dexamethasone and Midazolam  Airway Management Planned: LMA  Additional Equipment: None  Intra-op Plan:   Post-operative Plan: Extubation in OR  Informed Consent: I have reviewed the patients History and Physical, chart, labs and discussed the procedure including the risks, benefits and alternatives for the proposed anesthesia with the patient or authorized representative who has indicated his/her understanding and acceptance.     Dental advisory given  Plan Discussed with: CRNA  Anesthesia Plan Comments: (No results found for: WBC, HGB, HCT, MCV, PLT  Covid-19 Nucleic Acid Test Results Lab Results      Component                Value               Date                      SARSCOV2NAA              NEGATIVE            09/02/2020                SARSCOV2NAA              NEGATIVE            06/16/2020           )        Anesthesia Quick Evaluation

## 2020-09-04 NOTE — Discharge Instructions (Signed)
Diet: Clear liquids, advance to soft foods then regular diet as tolerated by this evening.  Pain control:   1)  Ibuprofen 600 mg by mouth every 6-8 hours as needed for pain  2)  Ice pack/cold compress to operated eye(s) as desired  Eye medications:   Tobradex or Zylet eye ointment 1/2 inch in operated eye(s) twice a day for one week  Activity: No swimming for 1 week.  It is OK to let water run over the face and eyes while showering or taking a bath, even during the first week.  No other restriction on exercise or activity.  If there is a patch on one eye, leave it in place until seen in Dr. Roxy Cedar office this afternoon for suture adjustment.  If there is no patch, you do not need to come to the office this afternoon; just come for your scheduled postop appointment.  Call Dr. Roxy Cedar office [phone] with any problems or concerns.    Post Anesthesia Home Care Instructions  Activity: Get plenty of rest for the remainder of the day. A responsible individual must stay with you for 24 hours following the procedure.  For the next 24 hours, DO NOT: -Drive a car -Advertising copywriter -Drink alcoholic beverages -Take any medication unless instructed by your physician -Make any legal decisions or sign important papers.  Meals: Start with liquid foods such as gelatin or soup. Progress to regular foods as tolerated. Avoid greasy, spicy, heavy foods. If nausea and/or vomiting occur, drink only clear liquids until the nausea and/or vomiting subsides. Call your physician if vomiting continues.  Special Instructions/Symptoms: Your throat may feel dry or sore from the anesthesia or the breathing tube placed in your throat during surgery. If this causes discomfort, gargle with warm salt water. The discomfort should disappear within 24 hours.  If you had a scopolamine patch placed behind your ear for the management of post- operative nausea and/or vomiting:  1. The medication in the patch is  effective for 72 hours, after which it should be removed.  Wrap patch in a tissue and discard in the trash. Wash hands thoroughly with soap and water. 2. You may remove the patch earlier than 72 hours if you experience unpleasant side effects which may include dry mouth, dizziness or visual disturbances. 3. Avoid touching the patch. Wash your hands with soap and water after contact with the patch.

## 2020-09-04 NOTE — Anesthesia Postprocedure Evaluation (Signed)
Anesthesia Post Note  Patient: Estes Lehner  Procedure(s) Performed: BILATERAL STRABISMUS REPAIR (Bilateral Eye)     Patient location during evaluation: PACU Anesthesia Type: General Level of consciousness: awake and alert Pain management: pain level controlled Vital Signs Assessment: post-procedure vital signs reviewed and stable Respiratory status: spontaneous breathing, nonlabored ventilation, respiratory function stable and patient connected to nasal cannula oxygen Cardiovascular status: blood pressure returned to baseline and stable Postop Assessment: no apparent nausea or vomiting Anesthetic complications: no   No complications documented.  Last Vitals:  Vitals:   09/04/20 0953 09/04/20 1016  BP:  133/86  Pulse: 87 85  Resp: 14 16  Temp:  37.6 C  SpO2: 100% 95%    Last Pain:  Vitals:   09/04/20 0945  TempSrc:   PainSc: 3                  Shelton Silvas

## 2020-09-04 NOTE — Interval H&P Note (Signed)
History and Physical Interval Note:  09/04/2020 7:28 AM  Kevin Garner  has presented today for surgery, with the diagnosis of EXOTROPIA.  The various methods of treatment have been discussed with the patient and family. After consideration of risks, benefits and other options for treatment, the patient has consented to  Procedure(s): REPAIR STRABISMUS BILATERAL (Bilateral) as a surgical intervention.  The patient's history has been reviewed, patient examined, no change in status, stable for surgery.  I have reviewed the patient's chart and labs.  Questions were answered to the patient's satisfaction.     Shara Blazing

## 2020-09-07 ENCOUNTER — Encounter (HOSPITAL_BASED_OUTPATIENT_CLINIC_OR_DEPARTMENT_OTHER): Payer: Self-pay | Admitting: Ophthalmology

## 2021-02-01 ENCOUNTER — Emergency Department (INDEPENDENT_AMBULATORY_CARE_PROVIDER_SITE_OTHER): Payer: BC Managed Care – PPO

## 2021-02-01 ENCOUNTER — Encounter: Payer: Self-pay | Admitting: Emergency Medicine

## 2021-02-01 ENCOUNTER — Other Ambulatory Visit: Payer: Self-pay

## 2021-02-01 ENCOUNTER — Emergency Department
Admission: EM | Admit: 2021-02-01 | Discharge: 2021-02-01 | Disposition: A | Discharge status: Home / Self Care | Payer: BC Managed Care – PPO | Source: Home / Self Care

## 2021-02-01 DIAGNOSIS — M5442 Lumbago with sciatica, left side: Secondary | ICD-10-CM | POA: Diagnosis not present

## 2021-02-01 DIAGNOSIS — M5441 Lumbago with sciatica, right side: Secondary | ICD-10-CM | POA: Diagnosis not present

## 2021-02-01 DIAGNOSIS — M5136 Other intervertebral disc degeneration, lumbar region: Secondary | ICD-10-CM | POA: Diagnosis not present

## 2021-02-01 MED ORDER — CYCLOBENZAPRINE HCL 5 MG PO TABS: 5.0000 mg | ORAL_TABLET | Freq: Two times a day (BID) | ORAL | 0 refills | Status: AC | PRN

## 2021-02-01 MED ORDER — KETOROLAC TROMETHAMINE 60 MG/2ML IM SOLN
60.0000 mg | Freq: Once | INTRAMUSCULAR | Status: AC
  Administered 2021-02-01: 60 mg via INTRAMUSCULAR

## 2021-02-01 MED ORDER — MELOXICAM 15 MG PO TABS: 15.0000 mg | ORAL_TABLET | Freq: Every day | ORAL | 0 refills | Status: AC

## 2021-02-01 MED ORDER — METHYLPREDNISOLONE ACETATE 80 MG/ML IJ SUSP: 80.0000 mg | Freq: Once | INTRAMUSCULAR | Status: DC

## 2021-02-01 MED ORDER — METHYLPREDNISOLONE ACETATE 80 MG/ML IJ SUSP
60.0000 mg | Freq: Once | INTRAMUSCULAR | Status: AC
  Administered 2021-02-01: 60 mg via INTRAMUSCULAR

## 2021-02-01 NOTE — ED Triage Notes (Signed)
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 Has not any covid or influenza vaccinations.

## 2021-02-01 NOTE — ED Provider Notes (Signed)
[facility]    CSN: [id number] Arrival date & time: 02/01/21  1851      History   Chief Complaint Chief Complaint  Patient presents with  . Back Pain    HPI Kevin Garner is a 40 y.o. male.   HPI Kevin Garner is a 39 y.o. 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  Pain radiating down both upper legs, aching and sore, worse with ambulating or going from sitting to standing position. Pain is 7/10.  He used vapo-rub with minimal relief. Hx of back pain in the past but not this severe.  Denies trouble going to the bathroom. Denies weakness or numbness in arms or legs.    Past Medical History:  Diagnosis Date  . Exotropia     Patient Active Problem List   Diagnosis Date Noted  . Injury of left shoulder 11/28/2013    Past Surgical History:  Procedure Laterality Date  . ROOT CANAL    . STRABISMUS SURGERY Bilateral 06/19/2020   Procedure: BILATERAL STRABISMUS REPAIR;  Surgeon: Verne Carrow, MD;  Location: [facility];  Service: Ophthalmology;  Laterality: Bilateral;  . STRABISMUS SURGERY Bilateral 09/04/2020   Procedure: BILATERAL STRABISMUS REPAIR;  Surgeon: Verne Carrow, MD;  Location: [facility];  Service: Ophthalmology;  Laterality: Bilateral;       Home Medications    Prior to Admission medications   Medication Sig Start Date End Date Taking? Authorizing Provider  cyclobenzaprine (FLEXERIL) 5 MG tablet Take 1-2 tablets (5-10 mg total) by mouth 2 (two) times daily as needed for muscle spasms. 02/01/21  Yes Jeyden Coffelt, Vangie Bicker, PA-C  meloxicam (MOBIC) 15 MG tablet Take 1 tablet (15 mg total) by mouth daily. 02/01/21  Yes Lurene Shadow, PA-C    Family History Family History  Problem Relation Age of Onset  . Sudden death Neg Hx   . Hypertension Neg Hx   . Hyperlipidemia Neg Hx   . Heart attack Neg Hx   . Diabetes Neg Hx      Social History Social History   Tobacco Use  . Smoking status: Former Smoker    Types: Cigarettes    Quit date: 08/28/2019    Years since quitting: 1.4  . Smokeless tobacco: Never Used  Vaping Use  . Vaping Use: Never used  Substance Use Topics  . Alcohol use: Not Currently  . Drug use: Never     Allergies   Patient has no known allergies.   Review of Systems Review of Systems  Constitutional: Negative for chills and fever.  Gastrointestinal: Negative for abdominal pain, nausea and vomiting.  Genitourinary: Negative for decreased urine volume, dysuria, flank pain, frequency, hematuria and urgency.  Musculoskeletal: Positive for back pain, gait problem and myalgias.  Skin: Negative for color change and wound.  Neurological: Negative for weakness and numbness.     Physical Exam Triage Vital Signs ED Triage Vitals  Enc Vitals Group     BP 02/01/21 1932 (!) 140/94     Pulse Rate 02/01/21 1932 85     Resp 02/01/21 1932 16     Temp 02/01/21 1932 98.2 F (36.8 C)     Temp Source 02/01/21 1932 Oral     SpO2 02/01/21 1932 96 %     Weight 02/01/21 1937 220 lb (99.8 kg)     Height 02/01/21 1937 6' (1.829 m)  Head Circumference --      Peak Flow --      Pain Score 02/01/21 1936 7     Pain Loc --      Pain Edu? --      Excl. in GC? --    No data found.  Updated Vital Signs BP (!) 140/94 (BP Location: Right Arm)   Pulse 85   Temp 98.2 F (36.8 C) (Oral)   Resp 16   Ht 6' (1.829 m)   Wt 220 lb (99.8 kg)   SpO2 96%   BMI 29.84 kg/m   Visual Acuity Right Eye Distance:   Left Eye Distance:   Bilateral Distance:    Right Eye Near:   Left Eye Near:    Bilateral Near:     Physical Exam Vitals and nursing note reviewed.  Constitutional:      General: He is not in acute distress.    Appearance: Normal appearance. He is well-developed and well-nourished. He is not ill-appearing, toxic-appearing or diaphoretic.  HENT:     Head: Normocephalic and  atraumatic.  Eyes:     Extraocular Movements: EOM normal.  Cardiovascular:     Rate and Rhythm: Normal rate and regular rhythm.  Pulmonary:     Effort: Pulmonary effort is normal. No respiratory distress.     Breath sounds: Normal breath sounds.  Musculoskeletal:        General: Tenderness present. No swelling. Normal range of motion.     Cervical back: Normal range of motion and neck supple. No rigidity or tenderness. No spinous process tenderness or muscular tenderness.     Comments: Tenderness across lumbar region and over lumbar spine. Increased pain with Left hip flexion. Antalgic gait, improves after taking a few steps.  Full ROM upper and lower extremities.   Skin:    General: Skin is warm and dry.     Capillary Refill: Capillary refill takes less than 2 seconds.     Findings: No bruising, erythema or rash.  Neurological:     Mental Status: He is alert and oriented to person, place, and time.     Sensory: No sensory deficit.  Psychiatric:        Mood and Affect: Mood and affect normal.        Behavior: Behavior normal.      UC Treatments / Results  Labs (all labs ordered are listed, but only abnormal results are displayed) Labs Reviewed - No data to display  EKG   Radiology DG Lumbar Spine Complete  Result Date: 02/01/2021 CLINICAL DATA:  Low back pain EXAM: LUMBAR SPINE - COMPLETE 4+ VIEW COMPARISON:  None. FINDINGS: Slight disc space narrowing at L4-5 and L5-S1 compatible with early degenerative disc disease. No fracture or subluxation. SI joints symmetric and unremarkable. 10 mm calcification projects over the the midportion of the right kidney, possibly right renal pelvic stone. IMPRESSION: No acute bony abnormality. 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 Suspect right renal pelvic stone, 10 mm. Electronically Signed   By: Charlett Nose M.D.   On: 02/01/2021 20:24    Procedures Procedures (including critical care time)  Medications Ordered in  UC Medications  methylPREDNISolone acetate (DEPO-MEDROL) injection 60 mg (60 mg Intramuscular Given 02/01/21 2033)  ketorolac (TORADOL) injection 60 mg (60 mg Intramuscular Given 02/01/21 2033)    Initial Impression / Assessment and Plan / UC Course  I have reviewed the triage vital signs and the nursing notes.  Pertinent labs & imaging  results that were available during my care of the patient were reviewed by me and considered in my medical decision making (see chart for details).     Hx and exam c/w low back strain Discussed imaging with pt No hx of kidney stones Discussed home tx Encouraged f/u with PCP and Sports Medicine later this week Discussed symptoms that warrant emergent care in the ED. AVS given  Final Clinical Impressions(s) / UC Diagnoses   Final diagnoses:  Acute bilateral low back pain with bilateral sciatica     Discharge Instructions      Meloxicam (Mobic) is an antiinflammatory to help with pain and inflammation.  Do not take ibuprofen, Advil, Aleve, or any other medications that contain NSAIDs while taking meloxicam as this may cause stomach upset or even ulcers if taken in large amounts for an extended period of time.   Flexeril (cyclobenzaprine) is a muscle relaxer and may cause drowsiness. Do not drink alcohol, drive, or operate heavy machinery while taking.  Call tomorrow to schedule a follow up appointment with primary care and sports medicine for further evaluation and treatment of your symptoms.    Call 911 or have someone drive you to the hospital if symptoms significantly worsening- trouble walking, trouble going to the bathroom, weakness or numbness in legs, or other new concerning symptoms develop.    ED Prescriptions    Medication Sig Dispense Auth. Provider   cyclobenzaprine (FLEXERIL) 5 MG tablet Take 1-2 tablets (5-10 mg total) by mouth 2 (two) times daily as needed for muscle spasms. 30 tablet Lurene Shadow, PA-C   meloxicam (MOBIC) 15 MG  tablet Take 1 tablet (15 mg total) by mouth daily. 14 tablet Lurene Shadow, [place]     I have reviewed the PDMP during this encounter.   Lurene Shadow, [place] 02/01/21 2042

## 2021-02-01 NOTE — Discharge Instructions (Addendum)
  Meloxicam (Mobic) is an antiinflammatory to help with pain and inflammation.  Do not take ibuprofen, Advil, Aleve, or any other medications that contain NSAIDs while taking meloxicam as this may cause stomach upset or even ulcers if taken in large amounts for an extended period of time.   Flexeril (cyclobenzaprine) is a muscle relaxer and may cause drowsiness. Do not drink alcohol, drive, or operate heavy machinery while taking.  Call tomorrow to schedule a follow up appointment with primary care and sports medicine for further evaluation and treatment of your symptoms.    Call 911 or have someone drive you to the hospital if symptoms significantly worsening- trouble walking, trouble going to the bathroom, weakness or numbness in legs, or other new concerning symptoms develop.

## 2021-02-05 ENCOUNTER — Emergency Department
Admission: EM | Admit: 2021-02-05 | Discharge: 2021-02-05 | Disposition: A | Discharge status: Home / Self Care | Payer: BC Managed Care – PPO | Source: Home / Self Care

## 2021-02-05 DIAGNOSIS — S39012D Strain of muscle, fascia and tendon of lower back, subsequent encounter: Secondary | ICD-10-CM | POA: Diagnosis not present

## 2021-02-05 NOTE — ED Provider Notes (Signed)
[facility]    CSN: [id number] Arrival date & time: 02/05/21  1103      History   Chief Complaint Chief Complaint  Patient presents with  . Back Pain  . Letter for School/Work    HPI Kevin Garner is a 40 y.o. male.   Patient was seen here earlier this week for back pain.  X-rays were apparently negative.  It began when he lifted something heavy.  Has a history of same problem.  Pain is not radiating.  HPI  Past Medical History:  Diagnosis Date  . Exotropia     Patient Active Problem List   Diagnosis Date Noted  . Injury of left shoulder 11/28/2013    Past Surgical History:  Procedure Laterality Date  . ROOT CANAL    . STRABISMUS SURGERY Bilateral 06/19/2020   Procedure: BILATERAL STRABISMUS REPAIR;  Surgeon: Verne Carrow, MD;  Location: [facility];  Service: Ophthalmology;  Laterality: Bilateral;  . STRABISMUS SURGERY Bilateral 09/04/2020   Procedure: BILATERAL STRABISMUS REPAIR;  Surgeon: Verne Carrow, MD;  Location: [facility];  Service: Ophthalmology;  Laterality: Bilateral;       Home Medications    Prior to Admission medications   Medication Sig Start Date End Date Taking? Authorizing Provider  cyclobenzaprine (FLEXERIL) 5 MG tablet Take 1-2 tablets (5-10 mg total) by mouth 2 (two) times daily as needed for muscle spasms. 02/01/21   Lurene Shadow, PA-C  meloxicam (MOBIC) 15 MG tablet Take 1 tablet (15 mg total) by mouth daily. 02/01/21   Lurene Shadow, PA-C    Family History Family History  Problem Relation Age of Onset  . Healthy Mother   . Healthy Father   . Sudden death Neg Hx   . Hypertension Neg Hx   . Hyperlipidemia Neg Hx   . Heart attack Neg Hx   . Diabetes Neg Hx     Social History Social History   Tobacco Use  . Smoking status: Former Smoker    Types: Cigarettes    Quit date: 08/28/2019    Years since quitting: 1.4  . Smokeless tobacco: Never Used  Vaping Use  . Vaping Use: Never used   Substance Use Topics  . Alcohol use: Not Currently  . Drug use: Never     Allergies   Patient has no known allergies.   Review of Systems Review of Systems  Musculoskeletal: Positive for back pain.     Physical Exam Triage Vital Signs ED Triage Vitals  Enc Vitals Group     BP 02/05/21 1116 (!) 148/99     Pulse Rate 02/05/21 1116 85     Resp 02/05/21 1116 15     Temp 02/05/21 1116 98.1 F (36.7 C)     Temp Source 02/05/21 1116 Oral     SpO2 02/05/21 1116 99 %     Weight --      Height --      Head Circumference --      Peak Flow --      Pain Score 02/05/21 1115 7     Pain Loc --      Pain Edu? --      Excl. in GC? --    No data found.  Updated Vital Signs BP (!) 148/99 (BP Location: Right Arm)   Pulse 85   Temp 98.1 F (36.7 C) (Oral)   Resp 15   SpO2 99%   Visual Acuity Right Eye Distance:   Left  Eye Distance:   Bilateral Distance:    Right Eye Near:   Left Eye Near:    Bilateral Near:     Physical Exam Vitals and nursing note reviewed.  Constitutional:      Appearance: Normal appearance.  Cardiovascular:     Rate and Rhythm: Normal rate.  Pulmonary:     Effort: Pulmonary effort is normal.  Musculoskeletal:     Comments: Back: Decreased range of motion in all planes straight leg raising is negative Deep tendon reflexes are symmetric muscular strength in lower extremities is symmetric  Neurological:     Mental Status: He is alert.      UC Treatments / Results  Labs (all labs ordered are listed, but only abnormal results are displayed) Labs Reviewed - No data to display  EKG   Radiology No results found.  Procedures Procedures (including critical care time)  Medications Ordered in UC Medications - No data to display  Initial Impression / Assessment and Plan / UC Course  I have reviewed the triage vital signs and the nursing notes.  Pertinent labs & imaging results that were available during my care of the patient were reviewed  by me and considered in my medical decision making (see chart for details).     Mechanical low back strain Final Clinical Impressions(s) / UC Diagnoses   Final diagnoses:  None   Discharge Instructions   None   Given exercises to do at home ED Prescriptions    None     PDMP not reviewed this encounter.   Frederica Kuster, MD 02/05/21 1130

## 2021-02-05 NOTE — ED Triage Notes (Signed)
Patient presents to Urgent Care with complaints of requesting extended work note since he was treated for lower back pain. 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 Pt has been taking both pain medications at the same time, not the meloxicam during his waking hours and muscle relaxer before bed.

## 2021-12-06 IMAGING — DX DG LUMBAR SPINE COMPLETE 4+V
5 series · 5 of 5 positions shown · non-contrast
Comparison: None.

CLINICAL DATA: Low back pain

EXAM:
LUMBAR SPINE - COMPLETE 4+ VIEW

[l-spine ap]
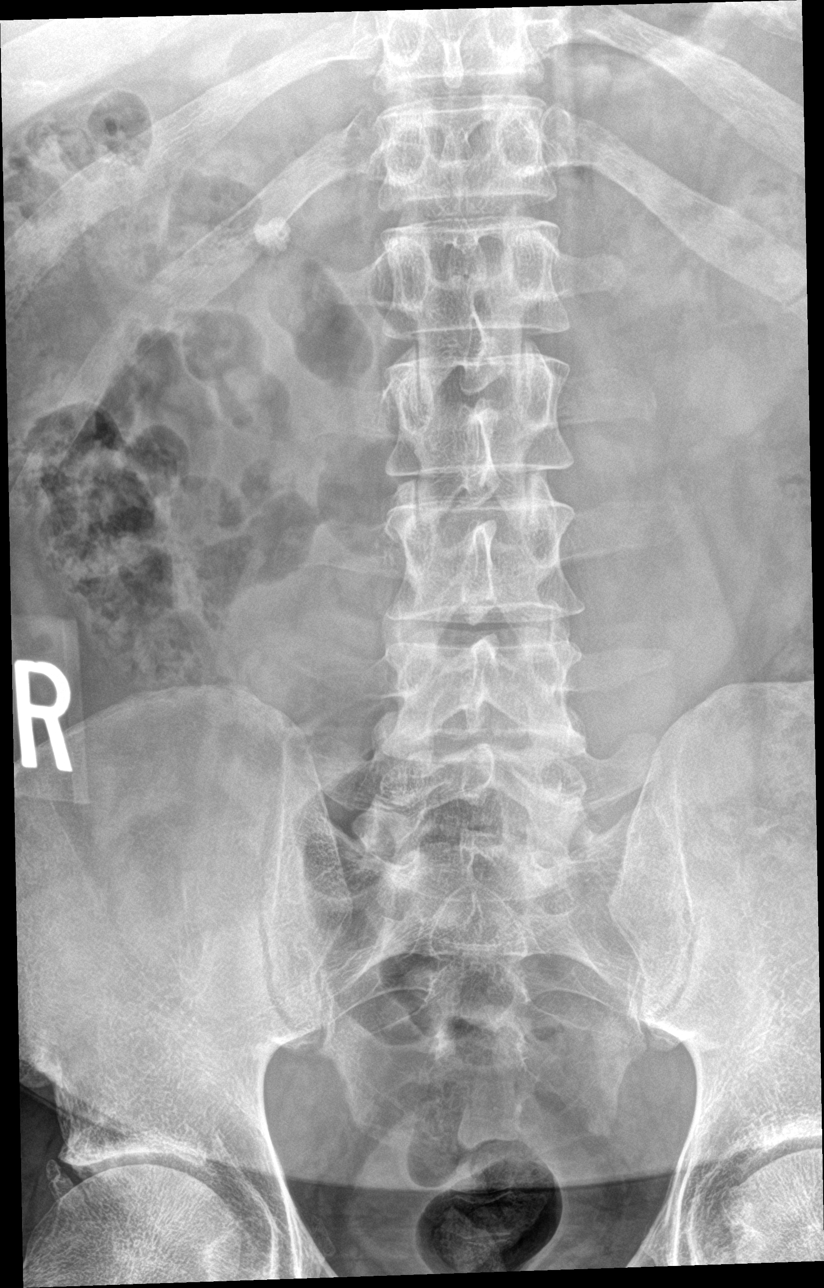

[l-spine obl (1 of 2)]
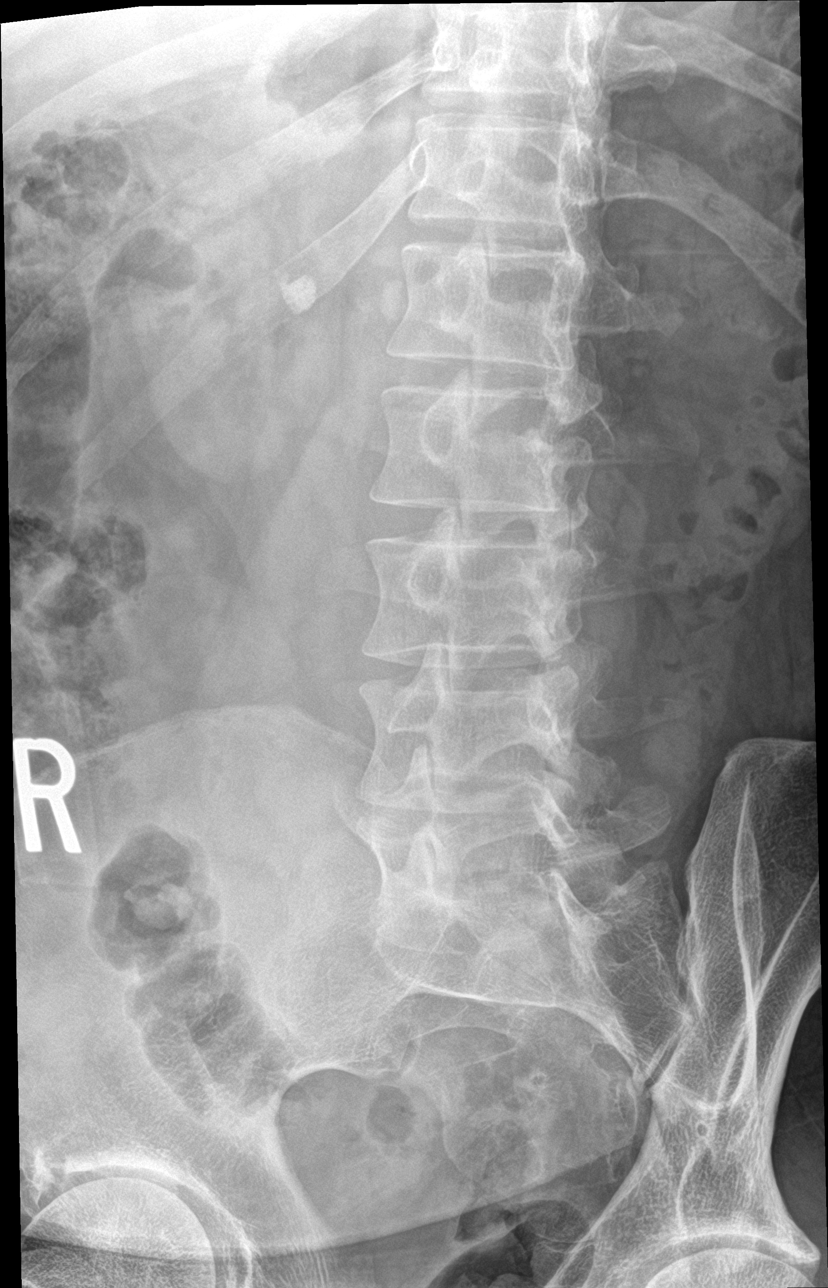

[l-spine obl (2 of 2)]
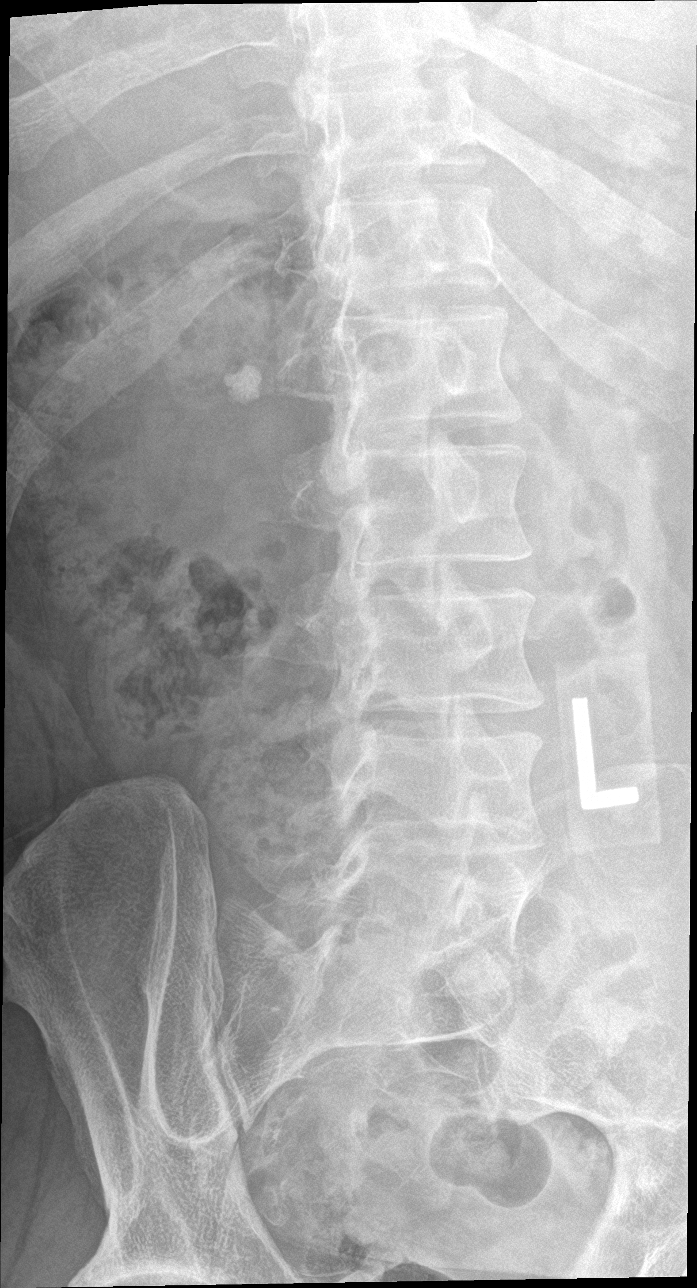

[l-spine lat]
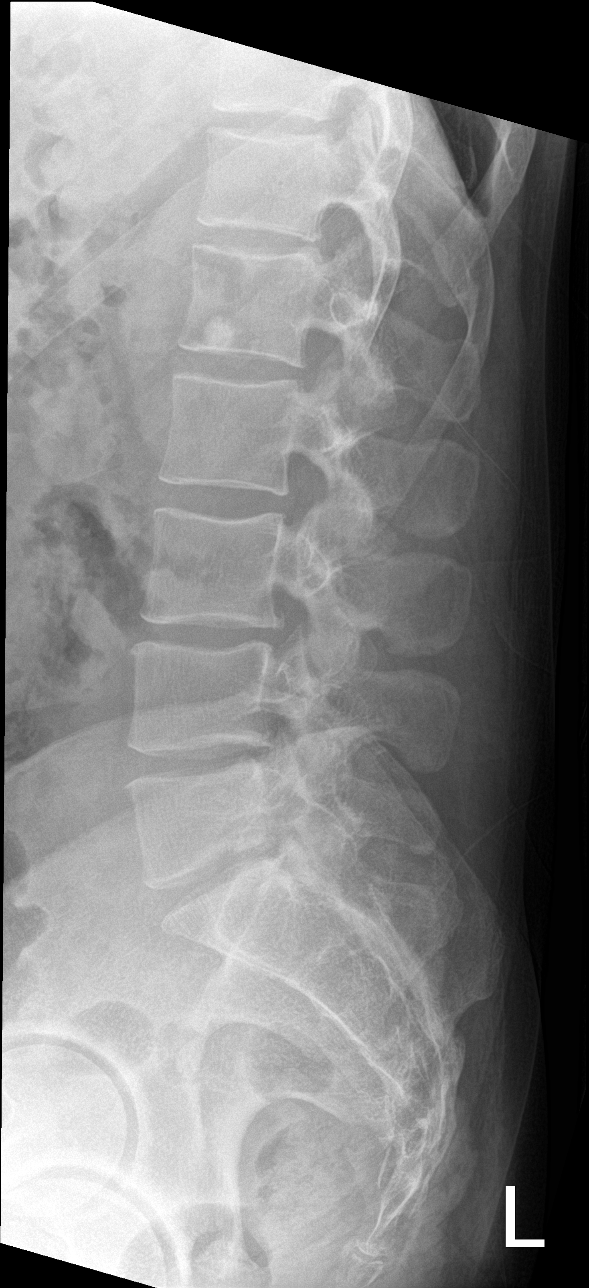

[l-spine spot]
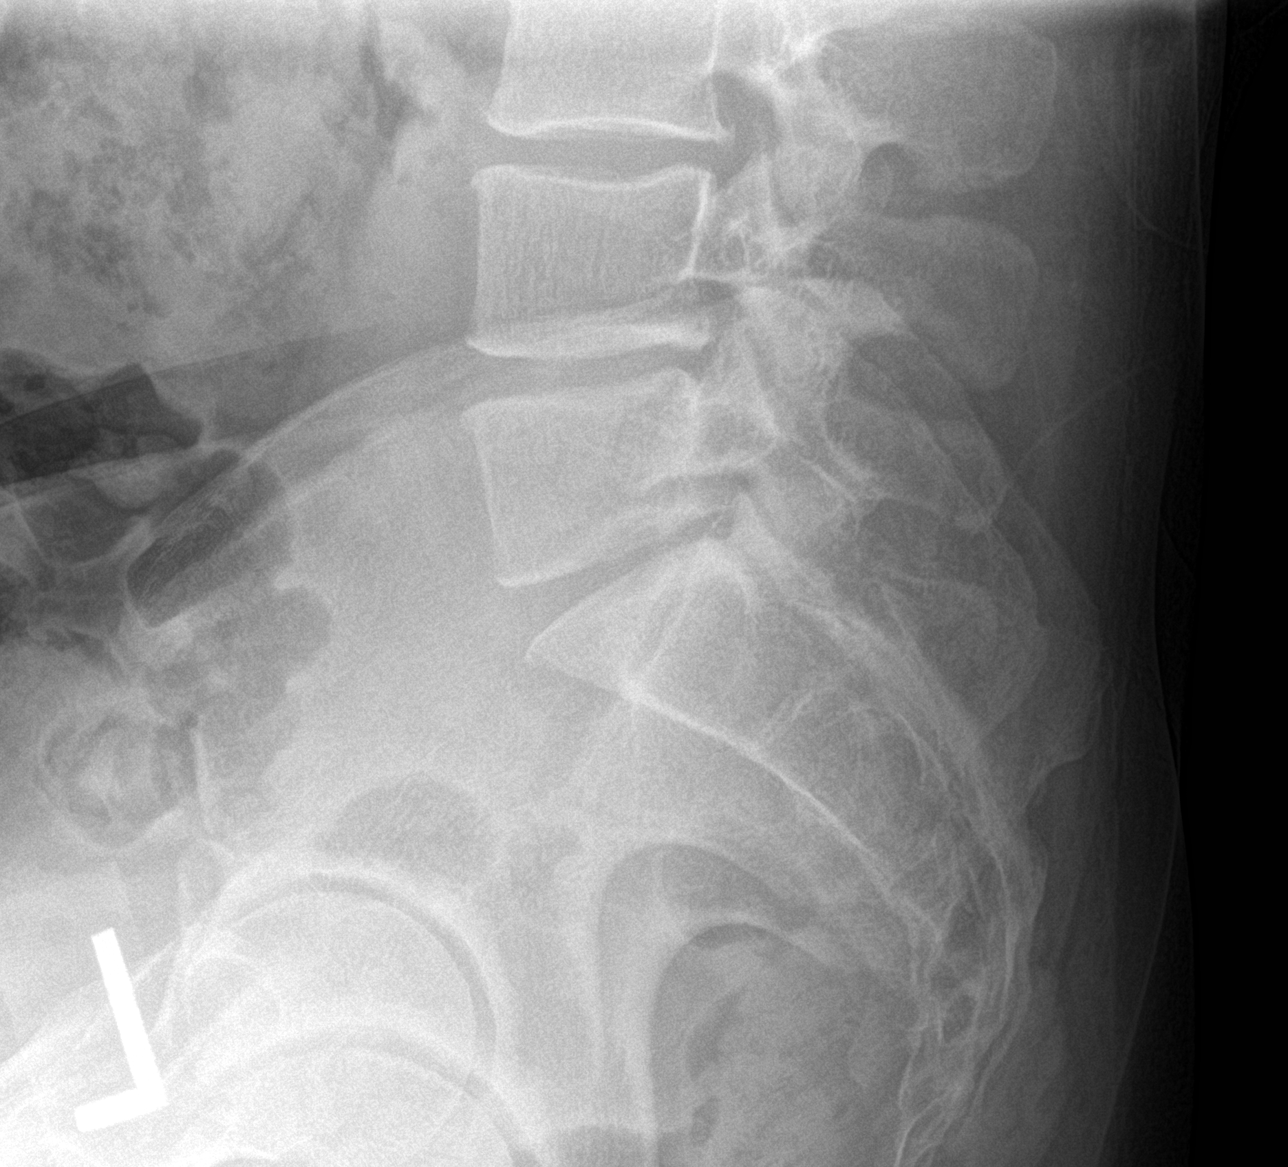

[5 of 5 positions shown; findings below may reference images not displayed]

FINDINGS: Slight disc space narrowing at L4-5 and L5-S1 compatible with early
degenerative disc disease. No fracture or subluxation. SI joints
symmetric and unremarkable.

10 mm calcification projects over the the midportion of the right
kidney, possibly right renal pelvic stone.
IMPRESSION: No acute bony abnormality. Early degenerative disc disease in the
lower lumbar spine.

Suspect right renal pelvic stone, 10 mm.
